# Patient Record
Sex: Male | Born: 1965 | Race: White | Hispanic: No | Smoking: Current every day smoker
Health system: Southern US, Community
[De-identification: ages and names within clinical notes are randomized; demographics above are authoritative.]

## PROBLEM LIST (undated history)

## (undated) DIAGNOSIS — I10 Essential (primary) hypertension: Secondary | ICD-10-CM

## (undated) DIAGNOSIS — M109 Gout, unspecified: Secondary | ICD-10-CM

## (undated) HISTORY — PX: KNEE SURGERY: SHX244

---

## 2006-03-11 ENCOUNTER — Emergency Department (HOSPITAL_COMMUNITY): Admission: EM | Admit: 2006-03-11 | Discharge: 2006-03-12 | Payer: Self-pay | Admitting: Emergency Medicine

## 2006-03-12 ENCOUNTER — Emergency Department (HOSPITAL_COMMUNITY): Admission: EM | Admit: 2006-03-12 | Discharge: 2006-03-13 | Payer: Self-pay | Admitting: Emergency Medicine

## 2006-03-15 ENCOUNTER — Emergency Department (HOSPITAL_COMMUNITY): Admission: EM | Admit: 2006-03-15 | Discharge: 2006-03-15 | Payer: Self-pay | Admitting: Family Medicine

## 2006-03-19 ENCOUNTER — Emergency Department (HOSPITAL_COMMUNITY): Admission: EM | Admit: 2006-03-19 | Discharge: 2006-03-19 | Payer: Self-pay | Admitting: Family Medicine

## 2009-10-24 ENCOUNTER — Emergency Department (HOSPITAL_BASED_OUTPATIENT_CLINIC_OR_DEPARTMENT_OTHER): Admission: EM | Admit: 2009-10-24 | Discharge: 2009-10-24 | Payer: Self-pay | Admitting: Emergency Medicine

## 2009-10-24 ENCOUNTER — Ambulatory Visit: Payer: Self-pay | Admitting: Diagnostic Radiology

## 2010-01-07 ENCOUNTER — Emergency Department (HOSPITAL_BASED_OUTPATIENT_CLINIC_OR_DEPARTMENT_OTHER): Admission: EM | Admit: 2010-01-07 | Discharge: 2010-01-07 | Payer: Self-pay | Admitting: Emergency Medicine

## 2010-01-07 ENCOUNTER — Ambulatory Visit: Payer: Self-pay | Admitting: Interventional Radiology

## 2010-01-14 ENCOUNTER — Emergency Department (HOSPITAL_BASED_OUTPATIENT_CLINIC_OR_DEPARTMENT_OTHER): Admission: EM | Admit: 2010-01-14 | Discharge: 2010-01-15 | Payer: Self-pay | Admitting: Emergency Medicine

## 2010-01-14 ENCOUNTER — Ambulatory Visit: Payer: Self-pay | Admitting: Diagnostic Radiology

## 2010-09-05 ENCOUNTER — Emergency Department (HOSPITAL_BASED_OUTPATIENT_CLINIC_OR_DEPARTMENT_OTHER)
Admission: EM | Admit: 2010-09-05 | Discharge: 2010-09-06 | Payer: Self-pay | Source: Home / Self Care | Admitting: Emergency Medicine

## 2010-12-09 LAB — URINE MICROSCOPIC-ADD ON

## 2010-12-09 LAB — BASIC METABOLIC PANEL
CO2: 29 mEq/L (ref 19–32)
Calcium: 9.3 mg/dL (ref 8.4–10.5)
Chloride: 106 mEq/L (ref 96–112)
Potassium: 4.4 mEq/L (ref 3.5–5.1)

## 2010-12-09 LAB — DIFFERENTIAL
Basophils Relative: 3 % — ABNORMAL HIGH (ref 0–1)
Eosinophils Absolute: 0 10*3/uL (ref 0.0–0.7)
Eosinophils Relative: 0 % (ref 0–5)
Monocytes Absolute: 0.9 10*3/uL (ref 0.1–1.0)
Monocytes Relative: 7 % (ref 3–12)
Neutro Abs: 10.6 10*3/uL — ABNORMAL HIGH (ref 1.7–7.7)

## 2010-12-09 LAB — URINALYSIS, ROUTINE W REFLEX MICROSCOPIC
Glucose, UA: NEGATIVE mg/dL
Leukocytes, UA: NEGATIVE
Nitrite: NEGATIVE
Protein, ur: NEGATIVE mg/dL
pH: 6 (ref 5.0–8.0)

## 2010-12-09 LAB — CBC
HCT: 44.8 % (ref 39.0–52.0)
Hemoglobin: 15.2 g/dL (ref 13.0–17.0)
MCHC: 34 g/dL (ref 30.0–36.0)
MCV: 96.6 fL (ref 78.0–100.0)
RDW: 11.9 % (ref 11.5–15.5)
WBC: 13.2 10*3/uL — ABNORMAL HIGH (ref 4.0–10.5)

## 2011-02-04 IMAGING — CT CT ABD-PELV W/O CM
2 of 4 series · 17 of 46 positions shown, 19 images · non-contrast
Comparison: None.

CLINICAL DATA: Right flank pain radiating into the low back and
testicle.

CT ABDOMEN AND PELVIS WITHOUT CONTRAST
TECHNIQUE: Multidetector CT imaging of the abdomen and pelvis was
performed following the standard protocol without intravenous
contrast.

[Series 2: renal stone < 200 lbs 5.0 b31f · axial · 0.72mm/px · z∈[-491,-46]mm · 14 of 97 slices shown, 16 images]
[im 4/97  soft-tissue]
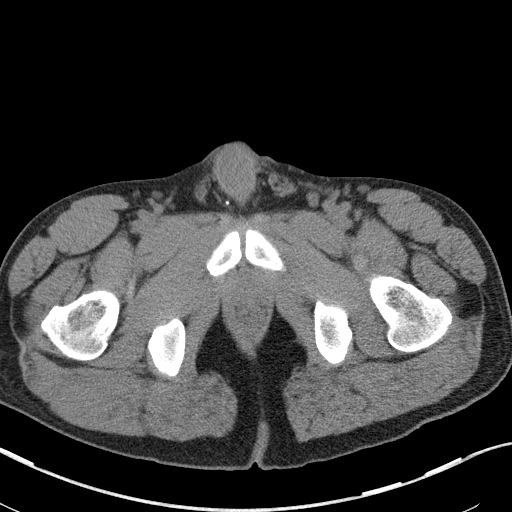
[im 4/97  bone]
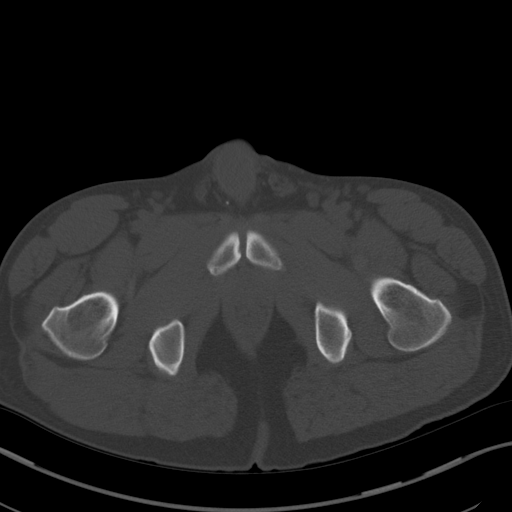
[im 12/97  soft-tissue]
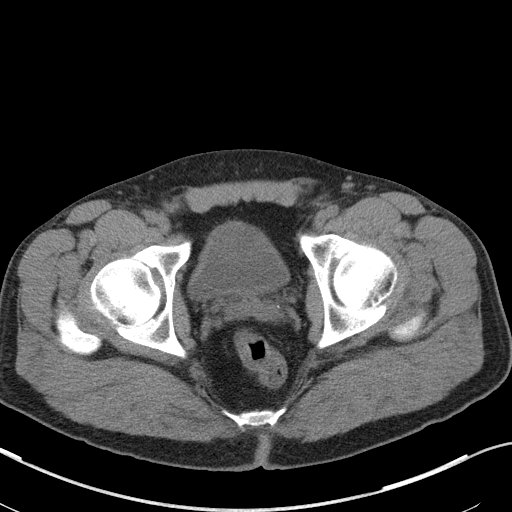
[im 19/97  soft-tissue]
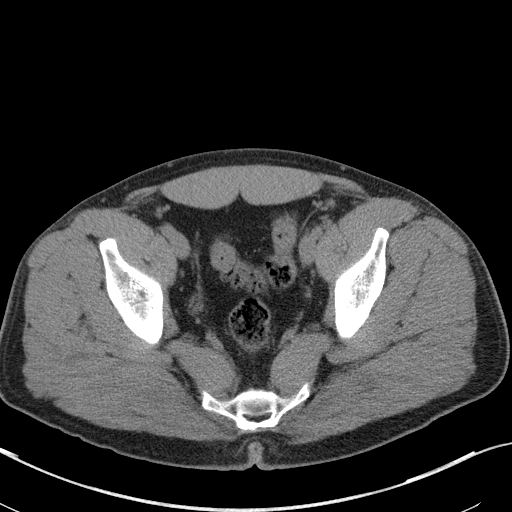
[im 26/97  soft-tissue]
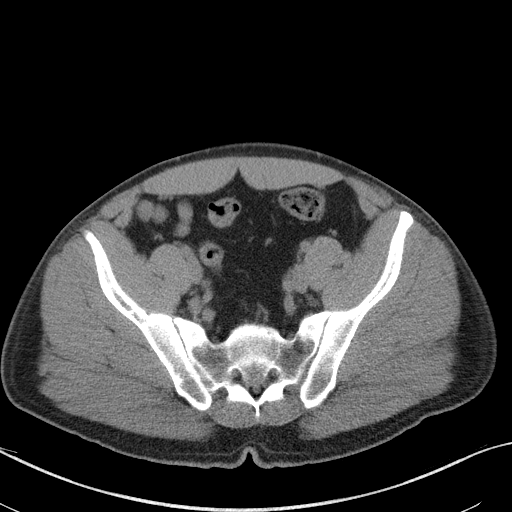
[im 34/97  soft-tissue]
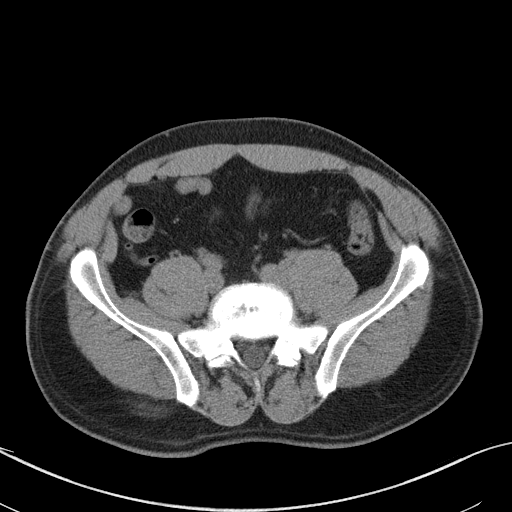
[im 37/97  soft-tissue]
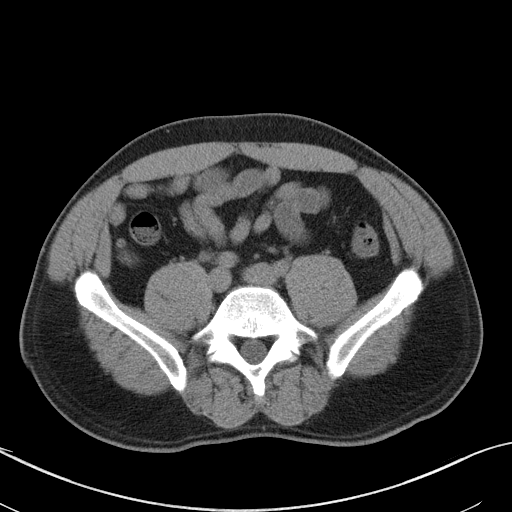
[im 45/97  soft-tissue]
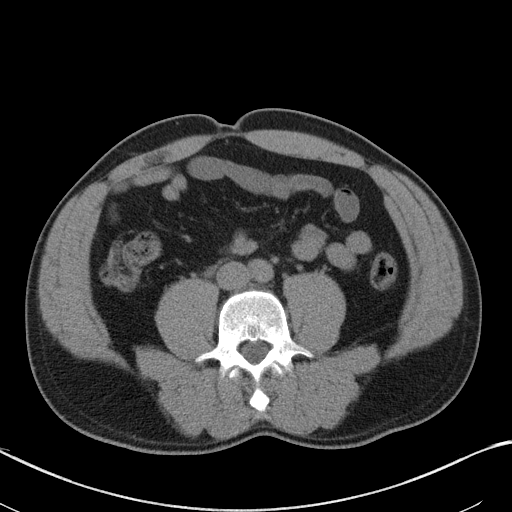
[im 52/97  soft-tissue]
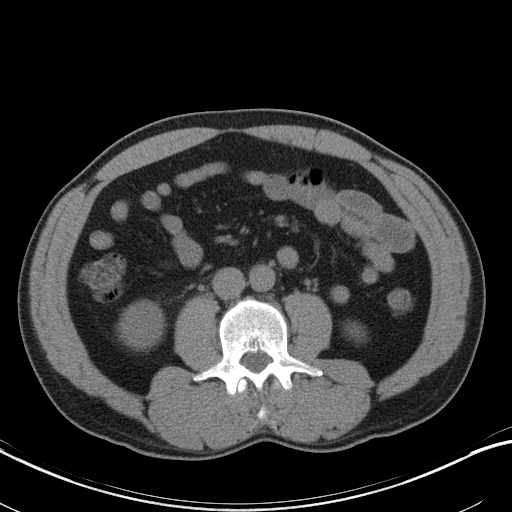
[im 60/97  soft-tissue]
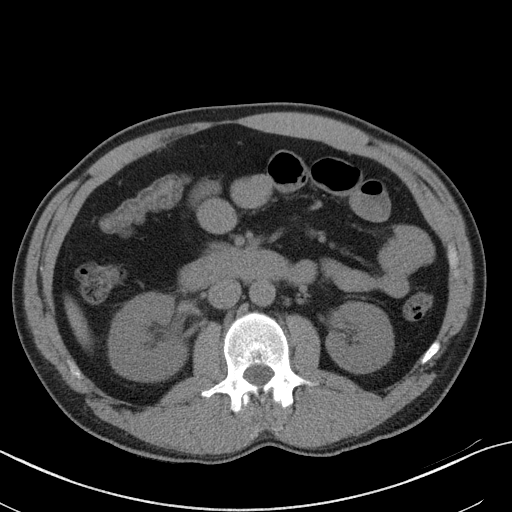
[im 60/97  bone]
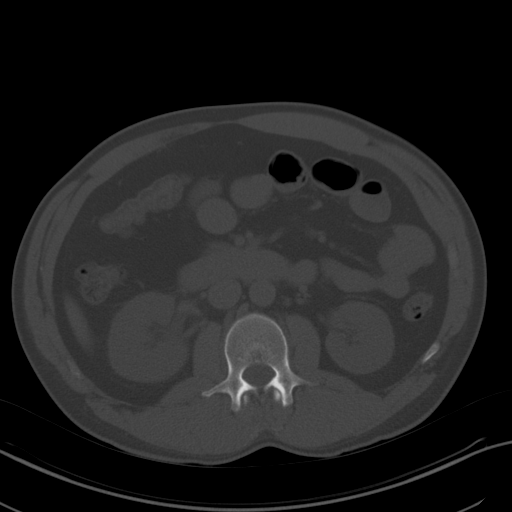
[im 63/97  soft-tissue]
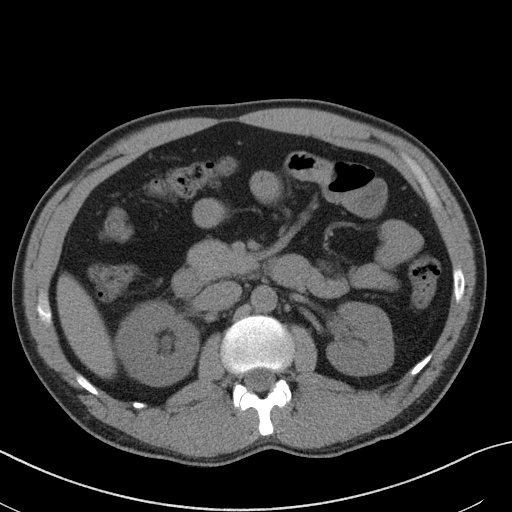
[im 71/97  soft-tissue]
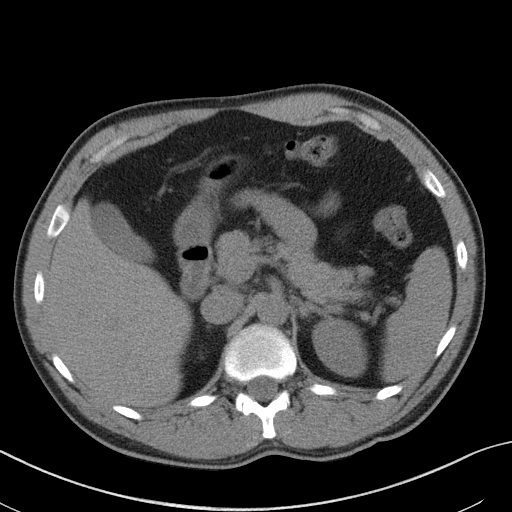
[im 78/97  soft-tissue]
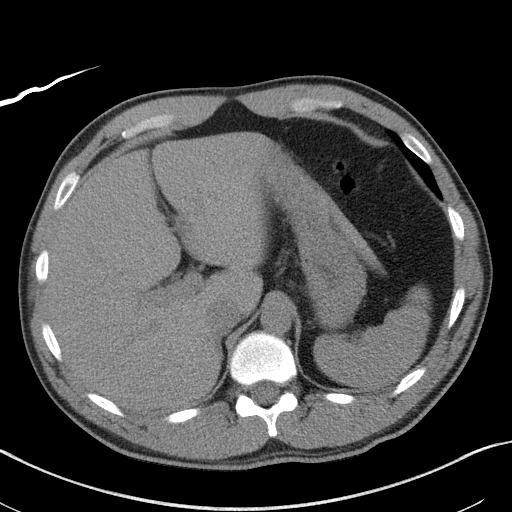
[im 85/97  soft-tissue]
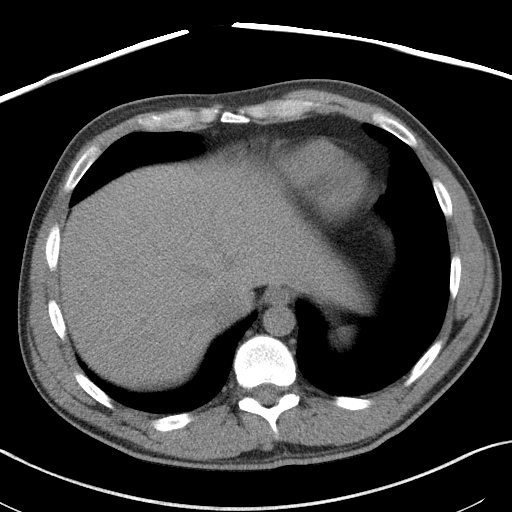
[im 93/97  soft-tissue]
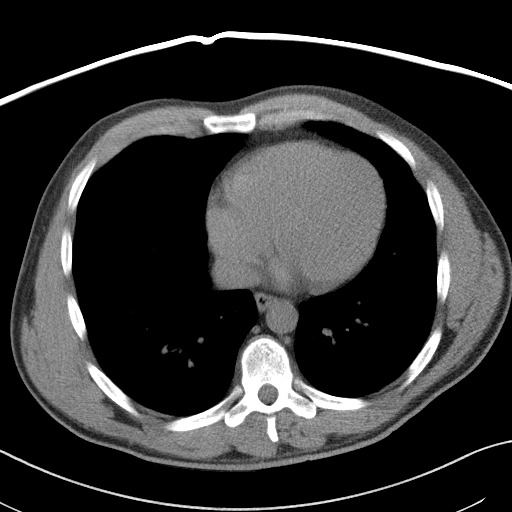

[Series 5: renal stone 3.0 coronal · coronal · 0.92mm/px · 3 of 92 slices shown]
[im 31/92  soft-tissue]
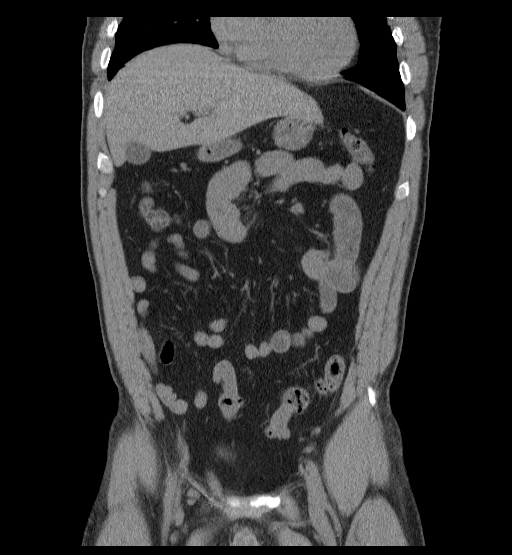
[im 41/92  soft-tissue]
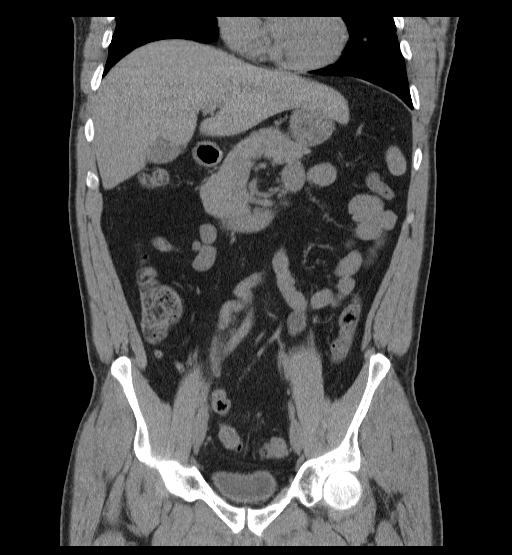
[im 51/92  soft-tissue]
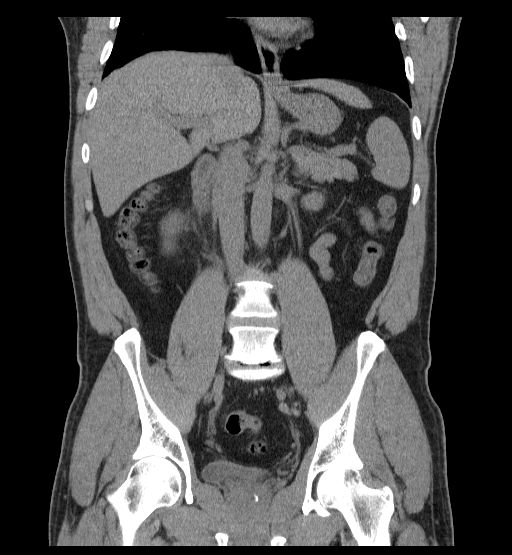

[17 of 46 positions shown; findings below may reference images not displayed]

FINDINGS: There is evidence of mild hydroureteronephrosis on the
right secondary to a distal ureteral calculus measuring
approximately 5 mm and located approximately 2 - 3 cm proximal to
the ureterovesicle junction.  A tiny nonobstructing calculus is
also present in the upper pole of the right kidney.  No left-sided
calculi or obstruction present.

Unenhanced solid organs including the liver, gallbladder, pancreas,
spleen and adrenal glands are unremarkable.  Bowel is normal in
appearance.  No abnormal fluid collections, masses or hernias.  The
bladder is decompressed.
IMPRESSION: Mild right-sided hydronephrosis and hydroureter secondary to 5 mm
distal ureteral calculus.

## 2011-10-02 ENCOUNTER — Encounter (HOSPITAL_BASED_OUTPATIENT_CLINIC_OR_DEPARTMENT_OTHER): Payer: Self-pay | Admitting: *Deleted

## 2011-10-02 ENCOUNTER — Emergency Department (HOSPITAL_BASED_OUTPATIENT_CLINIC_OR_DEPARTMENT_OTHER)
Admission: EM | Admit: 2011-10-02 | Discharge: 2011-10-02 | Disposition: A | Discharge status: Home / Self Care | Payer: Self-pay | Attending: Emergency Medicine | Admitting: Emergency Medicine

## 2011-10-02 DIAGNOSIS — M109 Gout, unspecified: Secondary | ICD-10-CM | POA: Insufficient documentation

## 2011-10-02 DIAGNOSIS — F17200 Nicotine dependence, unspecified, uncomplicated: Secondary | ICD-10-CM | POA: Insufficient documentation

## 2011-10-02 MED ORDER — HYDROCODONE-ACETAMINOPHEN 5-500 MG PO TABS: 1.0000 | ORAL_TABLET | Freq: Four times a day (QID) | ORAL | Status: AC | PRN

## 2011-10-02 MED ORDER — COLCHICINE 0.6 MG PO TABS: ORAL_TABLET | ORAL | Status: DC

## 2011-10-02 MED ORDER — INDOMETHACIN 50 MG PO CAPS: 50.0000 mg | ORAL_CAPSULE | Freq: Three times a day (TID) | ORAL | Status: AC

## 2011-10-02 MED ORDER — INDOMETHACIN 25 MG PO CAPS: 50.0000 mg | ORAL_CAPSULE | Freq: Three times a day (TID) | ORAL | Status: DC | PRN

## 2011-10-02 MED ORDER — INDOMETHACIN 25 MG PO CAPS
50.0000 mg | ORAL_CAPSULE | Freq: Once | ORAL | Status: AC
  Administered 2011-10-02: 50 mg via ORAL
  Filled 2011-10-02: qty 2

## 2011-10-02 NOTE — ED Provider Notes (Signed)
History     CSN: [id number]  Arrival date & time 10/02/11  0445   First MD Initiated Contact with Patient 10/02/11 0454      Chief Complaint  Patient presents with  . Elbow Pain    (Consider location/radiation/quality/duration/timing/severity/associated sxs/prior treatment) HPI Patient is a 46 year old male who presents today complaining of right elbow pain and swelling with no history of trauma. Patient did not notice this until he got home from work. He states his pain is now a 10 out of 10. 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 When asked patient does note that he has had attacks of gout before but that they have occurred in his feet. There are no overlying skin changes. Patient has no systemic signs of infection including nausea, vomiting, or fevers. Pain is worse with movement and palpation and better with rest. There are no other associated or modifying factors. History reviewed. No pertinent past medical history.  History reviewed. No pertinent past surgical history.  History reviewed. No pertinent family history.  History  Substance Use Topics  . Smoking status: Current Everyday Smoker  . Smokeless tobacco: Not on file  . Alcohol Use:       Review of Systems  Constitutional: Negative.   HENT: Negative.   Eyes: Negative.   Respiratory: Negative.   Cardiovascular: Negative.   Gastrointestinal: Negative.   Genitourinary: Negative.   Musculoskeletal: Positive for joint swelling.       Right elbow pain  Neurological: Negative.   Hematological: Negative.   Psychiatric/Behavioral: Negative.   All other systems reviewed and are negative.    Allergies  Review of patient's allergies indicates no known allergies.  Home Medications   Current Outpatient Rx  Name Route Sig Dispense Refill  . COLCHICINE 0.6 MG PO TABS  Take 2 tabs at first sign of gout flare.  If symptoms do not resolve take an additional tab one hour later.  6 tablet 0  . HYDROCODONE-ACETAMINOPHEN 5-500 MG PO TABS Oral Take 1-2 tablets by mouth every 6 (six) hours as needed for pain. 15 tablet 0  . INDOMETHACIN 50 MG PO CAPS Oral Take 1 capsule (50 mg total) by mouth 3 (three) times daily with meals. 30 capsule 0    BP 149/96  Pulse 75  Temp(Src) 97.4 F (36.3 C) (Oral)  Resp 18  SpO2 99%  Physical Exam  Nursing note and vitals reviewed. Constitutional: He is oriented to person, place, and time. He appears well-developed and well-nourished.  HENT:  Head: Normocephalic and atraumatic.  Eyes: Conjunctivae and EOM are normal. Pupils are equal, round, and reactive to light.  Neck: Normal range of motion.  Cardiovascular: Normal rate, regular rhythm, normal heart sounds and intact distal pulses.  Exam reveals no gallop and no friction rub.   No murmur heard. Pulmonary/Chest: Effort normal and breath sounds normal. No respiratory distress. He has no wheezes. He has no rales.  Musculoskeletal: Normal range of motion. He exhibits tenderness.       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 This is 2 inches in diameter. He states that it just appeared today. He noted pain only today. Area is tender to the touch but no overlying skin changes. This does not appear to be a septic joint  Neurological: He is alert and oriented to person, place, and time.  Skin: Skin is warm and dry. No rash noted.  Psychiatric: He has a normal mood and affect.  ED Course  Procedures (including critical care time)  Labs Reviewed - No data to display No results found.   1. Gout attack       MDM  Patient was evaluated by myself. He was given into the face and 50 mg by mouth for acute gout attack. Patient did have a history of gout and tophus was consistent with this. Patient did not have any overlying skin changes to suggest a cellulitis or septic joint. Patient was given a prescription for indomethacin. Patient was also given a prescription for  culture seen that he contrast this is ineffective. As a last ditch effort patient was given prescription for Vicodin if he does not have any resolution of his symptoms. Patient was told that he should pursue followup with her primary care doctor as he has had several attacks of gout and that he may need followup for this on long-term basis.        Cyndra Numbers, MD 10/02/11 [phone]

## 2011-10-02 NOTE — ED Notes (Signed)
C/o right elbow pain and swelling since yesterday, denies injury

## 2013-02-05 ENCOUNTER — Emergency Department (HOSPITAL_BASED_OUTPATIENT_CLINIC_OR_DEPARTMENT_OTHER)
Admission: EM | Admit: 2013-02-05 | Discharge: 2013-02-05 | Disposition: A | Discharge status: Home / Self Care | Payer: Self-pay | Attending: Emergency Medicine | Admitting: Emergency Medicine

## 2013-02-05 ENCOUNTER — Encounter (HOSPITAL_BASED_OUTPATIENT_CLINIC_OR_DEPARTMENT_OTHER): Payer: Self-pay | Admitting: *Deleted

## 2013-02-05 DIAGNOSIS — M109 Gout, unspecified: Secondary | ICD-10-CM | POA: Insufficient documentation

## 2013-02-05 DIAGNOSIS — F17200 Nicotine dependence, unspecified, uncomplicated: Secondary | ICD-10-CM | POA: Insufficient documentation

## 2013-02-05 HISTORY — DX: Gout, unspecified: M10.9

## 2013-02-05 MED ORDER — PREDNISONE 20 MG PO TABS: ORAL_TABLET | ORAL | Status: DC

## 2013-02-05 MED ORDER — PREDNISONE 50 MG PO TABS
60.0000 mg | ORAL_TABLET | Freq: Once | ORAL | Status: AC
  Administered 2013-02-05: 60 mg via ORAL
  Filled 2013-02-05: qty 1

## 2013-02-05 MED ORDER — HYDROCODONE-ACETAMINOPHEN 5-325 MG PO TABS: 2.0000 | ORAL_TABLET | Freq: Four times a day (QID) | ORAL | Status: DC | PRN

## 2013-02-05 NOTE — ED Notes (Signed)
Pt states he has a hx of gout and this same pain started in his right toe and has slowly worked its way up to his right knee. Difficulty ambulating.

## 2013-02-05 NOTE — ED Provider Notes (Signed)
History    This chart was scribed for Charles B. Bernette Mayers, MD by Quintella Reichert, ED scribe.  This patient was seen in room MH12/MH12 and the patient's care was started at 3:27 PM.   CSN: [id number]  Arrival date & time 02/05/13  1511     Chief Complaint  Patient presents with  . Gout     The history is provided by the patient. No language interpreter was used.    HPI Comments: ZARED KNOTH is a 47 y.o. male with h/o gout who presents to the Emergency Department complaining of gradually-worsening swelling and pain in joints of lower extremities that began 1 month ago.  Pt states that symptoms initially presented in his right great toe.  He began taking colchicine at that time, and subsequently symptoms switched to his left great toe.  Pt notes he was also treating with cherry extract pills at that time.  Several days ago pt noted swelling throughout both feet on waking, in all of the toes and in the ankles.  Today he began to observe swelling in the right knee as well.  He denies visible redness or tactile warmth in affected areas.  Pt denies fever, rash, emesis, SOB, or any other associated symptoms.  Pt notes mild diarrhea, possibly associated with colchicine. He notes that he is on his feet all day at work.  Pt also notes h/o cellulitis.   Past Medical History  Diagnosis Date  . Gout     Past Surgical History  Procedure Laterality Date  . Knee surgery      History reviewed. No pertinent family history.  History  Substance Use Topics  . Smoking status: Current Every Day Smoker  . Smokeless tobacco: Not on file  . Alcohol Use: No      Review of Systems A complete 10 system review of systems was obtained and all systems are negative except as noted in the HPI and PMH.    Allergies  Review of patient's allergies indicates no known allergies.  Home Medications   Current Outpatient Rx  Name  Route  Sig  Dispense  Refill  . colchicine 0.6 MG tablet      Take 2  tabs at first sign of gout flare.  If symptoms do not resolve take an additional tab one hour later.   6 tablet   0     BP 136/93  Pulse 72  Temp(Src) 98.2 F (36.8 C) (Oral)  Resp 20  Ht 5\' 10"  (1.778 m)  Wt 190 lb (86.183 kg)  BMI 27.26 kg/m2  SpO2 98%  Physical Exam  Nursing note and vitals reviewed. Constitutional: He is oriented to person, place, and time. He appears well-developed and well-nourished.  HENT:  Head: Normocephalic and atraumatic.  Eyes: EOM are normal. Pupils are equal, round, and reactive to light.  Neck: Normal range of motion. Neck supple.  Cardiovascular: Normal rate, regular rhythm, normal heart sounds and intact distal pulses.   No murmur heard. Pulmonary/Chest: Effort normal and breath sounds normal. No respiratory distress. He has no wheezes. He has no rales.  Abdominal: Bowel sounds are normal. He exhibits no distension. There is no tenderness.  Musculoskeletal: Normal range of motion. He exhibits edema and tenderness.  Tenderness in great toe of right foot, right ankle, right knee.  Minimal effusion.  No erythema or warmth.  Neurological: He is alert and oriented to person, place, and time. He has normal strength. No cranial nerve deficit or sensory deficit.  Skin: Skin is warm and dry. No rash noted.  Psychiatric: He has a normal mood and affect.    ED Course  Procedures (including critical care time)  DIAGNOSTIC STUDIES: Oxygen Saturation is 98% on room air, normal by my interpretation.    COORDINATION OF CARE: 3:32 PM-Discussed treatment plan which includes steroid medication, pain medication and f/u with orthopedist with pt at bedside and pt agreed to plan.      Labs Reviewed - No data to display No results found.   1. Gout       MDM  Likely gout exacerbation. Prednisone, percocet and PCP follow up      I personally performed the services described in this documentation, which was scribed in my presence. The recorded  information has been reviewed and is accurate.    Charles B. Bernette Mayers, MD 02/06/13 1453

## 2013-02-05 NOTE — ED Notes (Signed)
D/c with rx x 2 for norco and prednisone

## 2014-08-06 ENCOUNTER — Encounter (HOSPITAL_BASED_OUTPATIENT_CLINIC_OR_DEPARTMENT_OTHER): Payer: Self-pay | Admitting: *Deleted

## 2014-08-06 ENCOUNTER — Emergency Department (HOSPITAL_BASED_OUTPATIENT_CLINIC_OR_DEPARTMENT_OTHER)
Admission: EM | Admit: 2014-08-06 | Discharge: 2014-08-06 | Disposition: A | Discharge status: Home / Self Care | Payer: Self-pay | Attending: Emergency Medicine | Admitting: Emergency Medicine

## 2014-08-06 DIAGNOSIS — R1110 Vomiting, unspecified: Secondary | ICD-10-CM | POA: Insufficient documentation

## 2014-08-06 DIAGNOSIS — R05 Cough: Secondary | ICD-10-CM

## 2014-08-06 DIAGNOSIS — R197 Diarrhea, unspecified: Secondary | ICD-10-CM | POA: Insufficient documentation

## 2014-08-06 DIAGNOSIS — Z8739 Personal history of other diseases of the musculoskeletal system and connective tissue: Secondary | ICD-10-CM | POA: Insufficient documentation

## 2014-08-06 DIAGNOSIS — R059 Cough, unspecified: Secondary | ICD-10-CM

## 2014-08-06 DIAGNOSIS — Z720 Tobacco use: Secondary | ICD-10-CM | POA: Insufficient documentation

## 2014-08-06 DIAGNOSIS — M791 Myalgia: Secondary | ICD-10-CM | POA: Insufficient documentation

## 2014-08-06 DIAGNOSIS — B9789 Other viral agents as the cause of diseases classified elsewhere: Secondary | ICD-10-CM

## 2014-08-06 DIAGNOSIS — J069 Acute upper respiratory infection, unspecified: Secondary | ICD-10-CM | POA: Insufficient documentation

## 2014-08-06 DIAGNOSIS — J029 Acute pharyngitis, unspecified: Secondary | ICD-10-CM

## 2014-08-06 NOTE — Discharge Instructions (Signed)
Cough, Adult  A cough is a reflex that helps clear your throat and airways. It can help heal the body or may be a reaction to an irritated airway. A cough may only last 2 or 3 weeks (acute) or may last more than 8 weeks (chronic).  CAUSES Acute cough:  Viral or bacterial infections. Chronic cough:  Infections.  Allergies.  Asthma.  Post-nasal drip.  Smoking.  Heartburn or acid reflux.  Some medicines.  Chronic lung problems (COPD).  Cancer. SYMPTOMS   Cough.  Fever.  Chest pain.  Increased breathing rate.  High-pitched whistling sound when breathing (wheezing).  Colored mucus that you cough up (sputum). TREATMENT   A bacterial cough may be treated with antibiotic medicine.  A viral cough must run its course and will not respond to antibiotics.  Your caregiver may recommend other treatments if you have a chronic cough. HOME CARE INSTRUCTIONS   Only take over-the-counter or prescription medicines for pain, discomfort, or fever as directed by your caregiver. Use cough suppressants only as directed by your caregiver.  Use a cold steam vaporizer or humidifier in your bedroom or home to help loosen secretions.  Sleep in a semi-upright position if your cough is worse at night.  Rest as needed.  Stop smoking if you smoke. SEEK IMMEDIATE MEDICAL CARE IF:   You have pus in your sputum.  Your cough starts to worsen.  You cannot control your cough with suppressants and are losing sleep.  You begin coughing up blood.  You have difficulty breathing.  You develop pain which is getting worse or is uncontrolled with medicine.  You have a fever. MAKE SURE YOU:   Understand these instructions.  Will watch your condition.  Will get help right away if you are not doing well or get worse. Document Released: 03/06/2011 Document Revised: 11/30/2011 Document Reviewed: 03/06/2011 [employer] Patient Information 2015 [employer]. This information is not intended  to replace advice given to you by your health care provider. Make sure you discuss any questions you have with your health care provider.  

## 2014-08-06 NOTE — ED Provider Notes (Signed)
CSN: [id number]     Arrival date & time 08/06/14  0727 History   First MD Initiated Contact with Patient 08/06/14 [phone]     Chief Complaint  Patient presents with  . Cough      Patient is a 48 y.o. male presenting with cough. The history is provided by the patient.  Cough Severity:  Moderate Onset quality:  Gradual Duration:  2 days Timing:  Intermittent Progression:  Worsening Chronicity:  New Smoker: yes   Relieved by:  None tried Worsened by:  Nothing tried Associated symptoms: chills, myalgias, shortness of breath and sore throat   Associated symptoms: no fever     Past Medical History  Diagnosis Date  . Gout    Past Surgical History  Procedure Laterality Date  . Knee surgery     History reviewed. No pertinent family history. History  Substance Use Topics  . Smoking status: Current Every Day Smoker  . Smokeless tobacco: Not on file  . Alcohol Use: No    Review of Systems  Constitutional: Positive for chills. Negative for fever.  HENT: Positive for sore throat.   Respiratory: Positive for cough and shortness of breath.   Cardiovascular:       CP with cough   Gastrointestinal: Positive for vomiting and diarrhea.  Musculoskeletal: Positive for myalgias.  All other systems reviewed and are negative.     Allergies  Review of patient's allergies indicates no known allergies.  Home Medications   Prior to Admission medications   Not on File   BP 132/90 mmHg  Pulse 68  Temp(Src) 98.3 F (36.8 C) (Oral)  Resp 20  Ht 5\' 10"  (1.778 m)  Wt 190 lb (86.183 kg)  BMI 27.26 kg/m2  SpO2 96% Physical Exam CONSTITUTIONAL: Well developed/well nourished HEAD: Normocephalic/atraumatic EYES: EOMI/PERRL ENMT: Mucous membranes moist, uvula midline, mild erythema, no exudates noted Bilateral TMs clear and intact NECK: supple no meningeal signs SPINE:entire spine nontender CV: S1/S2 noted, no murmurs/rubs/gallops noted LUNGS: Lungs are clear to auscultation  bilaterally, no apparent distress ABDOMEN: soft, nontender, no rebound or guarding GU:no cva tenderness NEURO: Pt is awake/alert, moves all extremitiesx4 EXTREMITIES: pulses normal, full ROM SKIN: warm, color normal PSYCH: no abnormalities of mood noted  ED Course  Procedures   Pt well appearing, here with cough/ST He is in no distress Lung sounds clear Suspect viral URI Appropriate for d/c home   MDM   Final diagnoses:  Cough  Pharyngitis  Viral URI with cough    Nursing notes including past medical history and social history reviewed and considered in documentation     Joya Gaskinsonald W Burnham Trost, MD 08/06/14 [phone]

## 2014-08-06 NOTE — ED Notes (Signed)
Cough, sore throat x 2 days

## 2014-10-17 ENCOUNTER — Emergency Department (HOSPITAL_BASED_OUTPATIENT_CLINIC_OR_DEPARTMENT_OTHER)
Admission: EM | Admit: 2014-10-17 | Discharge: 2014-10-17 | Disposition: A | Discharge status: Home / Self Care | Payer: Self-pay | Attending: Emergency Medicine | Admitting: Emergency Medicine

## 2014-10-17 ENCOUNTER — Encounter (HOSPITAL_BASED_OUTPATIENT_CLINIC_OR_DEPARTMENT_OTHER): Payer: Self-pay | Admitting: *Deleted

## 2014-10-17 ENCOUNTER — Emergency Department (HOSPITAL_BASED_OUTPATIENT_CLINIC_OR_DEPARTMENT_OTHER): Payer: Self-pay

## 2014-10-17 DIAGNOSIS — Z79899 Other long term (current) drug therapy: Secondary | ICD-10-CM | POA: Insufficient documentation

## 2014-10-17 DIAGNOSIS — Y9389 Activity, other specified: Secondary | ICD-10-CM | POA: Insufficient documentation

## 2014-10-17 DIAGNOSIS — Z720 Tobacco use: Secondary | ICD-10-CM | POA: Insufficient documentation

## 2014-10-17 DIAGNOSIS — Y998 Other external cause status: Secondary | ICD-10-CM | POA: Insufficient documentation

## 2014-10-17 DIAGNOSIS — R109 Unspecified abdominal pain: Secondary | ICD-10-CM

## 2014-10-17 DIAGNOSIS — Y9289 Other specified places as the place of occurrence of the external cause: Secondary | ICD-10-CM | POA: Insufficient documentation

## 2014-10-17 DIAGNOSIS — S29019A Strain of muscle and tendon of unspecified wall of thorax, initial encounter: Secondary | ICD-10-CM | POA: Insufficient documentation

## 2014-10-17 DIAGNOSIS — X58XXXA Exposure to other specified factors, initial encounter: Secondary | ICD-10-CM | POA: Insufficient documentation

## 2014-10-17 DIAGNOSIS — Z8739 Personal history of other diseases of the musculoskeletal system and connective tissue: Secondary | ICD-10-CM | POA: Insufficient documentation

## 2014-10-17 DIAGNOSIS — S29011A Strain of muscle and tendon of front wall of thorax, initial encounter: Secondary | ICD-10-CM

## 2014-10-17 DIAGNOSIS — I10 Essential (primary) hypertension: Secondary | ICD-10-CM | POA: Insufficient documentation

## 2014-10-17 HISTORY — DX: Essential (primary) hypertension: I10

## 2014-10-17 MED ORDER — HYDROCODONE-ACETAMINOPHEN 5-325 MG PO TABS
2.0000 | ORAL_TABLET | Freq: Once | ORAL | Status: AC
  Administered 2014-10-17: 2 via ORAL
  Filled 2014-10-17: qty 2

## 2014-10-17 MED ORDER — IBUPROFEN 400 MG PO TABS: 400.0000 mg | ORAL_TABLET | Freq: Four times a day (QID) | ORAL | Status: AC | PRN

## 2014-10-17 MED ORDER — IBUPROFEN 400 MG PO TABS
400.0000 mg | ORAL_TABLET | Freq: Once | ORAL | Status: AC
  Administered 2014-10-17: 400 mg via ORAL
  Filled 2014-10-17: qty 1

## 2014-10-17 MED ORDER — HYDROCODONE-ACETAMINOPHEN 5-325 MG PO TABS: 1.0000 | ORAL_TABLET | ORAL | Status: AC | PRN

## 2014-10-17 NOTE — ED Notes (Signed)
C/o pain after sneezing yesterday. Felt something pop. Pain is right lower rib pain radiating in to umbilical area. Also c/o burning. States hurts worse with movement and deep breaths.

## 2014-10-17 NOTE — ED Provider Notes (Signed)
CSN: [id number]     Arrival date & time 10/17/14  1021 History   First MD Initiated Contact with Patient 10/17/14 1145     Chief Complaint  Patient presents with  . Abdominal Pain     (Consider location/radiation/quality/duration/timing/severity/associated sxs/prior Treatment) HPI The patient reports he sneezed yesterday and felt something pop in his right side. He reports ever since then he's had of fairly severe pain in his right lower ribs that shoots across his abdomen somewhat. He reports is burning and sharp in nature. He reports that twisting or bending makes it much worse. He denies feeling short of breath. He denies any history of other trauma to the chest wall. He has not had any associated nausea or vomiting.  He reports he was well prior to onset of the symptoms. Past Medical History  Diagnosis Date  . Hypertension   . Gout    Past Surgical History  Procedure Laterality Date  . Knee surgery     No family history on file. History  Substance Use Topics  . Smoking status: Current Every Day Smoker -- 1.00 packs/day    Types: Cigarettes  . Smokeless tobacco: Not on file  . Alcohol Use: No    Review of Systems 10 Systems reviewed and are negative for acute change except as noted in the HPI.    Allergies  Review of patient's allergies indicates no known allergies.  Home Medications   Prior to Admission medications   Medication Sig Start Date End Date Taking? Authorizing Provider  atorvastatin (LIPITOR) 10 MG tablet Take 10 mg by mouth daily.   Yes Historical Provider, MD  lisinopril (PRINIVIL,ZESTRIL) 20 MG tablet Take 20 mg by mouth daily.   Yes Historical Provider, MD  HYDROcodone-acetaminophen (NORCO/VICODIN) 5-325 MG per tablet Take 1-2 tablets by mouth every 4 (four) hours as needed for moderate pain or severe pain. 10/17/14   Arby BarretteMarcy Elsie Sakuma, MD  ibuprofen (ADVIL,MOTRIN) 400 MG tablet Take 1 tablet (400 mg total) by mouth every 6 (six) hours as needed. 10/17/14    Arby BarretteMarcy Tashaun Obey, MD   BP 136/90 mmHg  Pulse 72  Temp(Src) 98.1 F (36.7 C) (Oral)  Resp 18  Ht 5' 9.5" (1.765 m)  Wt 188 lb (85.276 kg)  BMI 27.37 kg/m2  SpO2 100% Physical Exam  Constitutional: He is oriented to person, place, and time. He appears well-developed and well-nourished.  HENT:  Head: Normocephalic and atraumatic.  Eyes: EOM are normal. Pupils are equal, round, and reactive to light.  Neck: Neck supple.  Cardiovascular: Normal rate, regular rhythm, normal heart sounds and intact distal pulses.   Pulmonary/Chest: Effort normal and breath sounds normal. No respiratory distress. He has no wheezes. He has no rales. He exhibits tenderness.    The patient has very focal reproducible chest wall pain that traces along approximately the eighth rib posteriorly to the anterior rib margin.  Abdominal: Soft. Bowel sounds are normal. He exhibits no distension. There is no tenderness.  No palpable hernias present.  Musculoskeletal: Normal range of motion. He exhibits no edema.  Neurological: He is alert and oriented to person, place, and time. He has normal strength. Coordination normal. GCS eye subscore is 4. GCS verbal subscore is 5. GCS motor subscore is 6.  Skin: Skin is warm, dry and intact.  Psychiatric: He has a normal mood and affect.    ED Course  Procedures (including critical care time) Labs Review Labs Reviewed - No data to display  Imaging Review Dg Chest 2  View  10/17/2014   CLINICAL DATA:  49 year old male with acute onset anterior rib pain after sneezing. Initial encounter.  EXAM: CHEST  2 VIEW  COMPARISON:  Chest radiographs 10/24/2009.  FINDINGS: Stable and normal lung volumes. Normal cardiac size and mediastinal contours. Visualized tracheal air column is within normal limits. No pneumothorax, pulmonary edema, pleural effusion or confluent pulmonary opacity. Chronic degenerative endplate changes in the mid thoracic spine. Bone mineralization is within normal  limits. No acute osseous abnormality identified.  IMPRESSION: No acute cardiopulmonary abnormality.   Electronically Signed   By: Augusto Gamble M.D.   On: 10/17/2014 12:35   Dg Ribs Unilateral Right  10/17/2014   CLINICAL DATA:  49 year old male with acute onset anterior rib pain after sneezing. Initial encounter.  EXAM: RIGHT RIBS - 2 VIEW  COMPARISON:  Chest radiographs from today reported separately. CT Abdomen and Pelvis 01/07/2010.  FINDINGS: Three oblique views of the right ribs. Normal thoracic segmentation. Rib marker placed at the anterior lateral right tenth rib level. No acute displaced rib fracture identified. No acute osseous abnormality identified. Right lung base within normal limits.  IMPRESSION: No displaced right rib fracture identified.   Electronically Signed   By: Augusto Gamble M.D.   On: 10/17/2014 12:37     EKG Interpretation None      MDM   Final diagnoses:  Chest wall muscle strain, initial encounter    Patient's history and physical examination are consistent with a chest wall strain. The x-rays do not show any rib fracture or pneumothorax. The patient is not experiencing dyspnea. At this time he will be treated for pain and instructed to follow-up with his family physician.   Arby Barrette, MD 10/17/14 1249

## 2014-10-17 NOTE — Discharge Instructions (Signed)

## 2015-11-14 IMAGING — CR DG CHEST 2V
2 series · 2 of 2 positions shown · non-contrast
Comparison: Chest radiographs 10/24/2009.

CLINICAL DATA: 48-year-old male with acute onset anterior rib pain
after sneezing. Initial encounter.

EXAM:
CHEST  2 VIEW

[w chest pa]
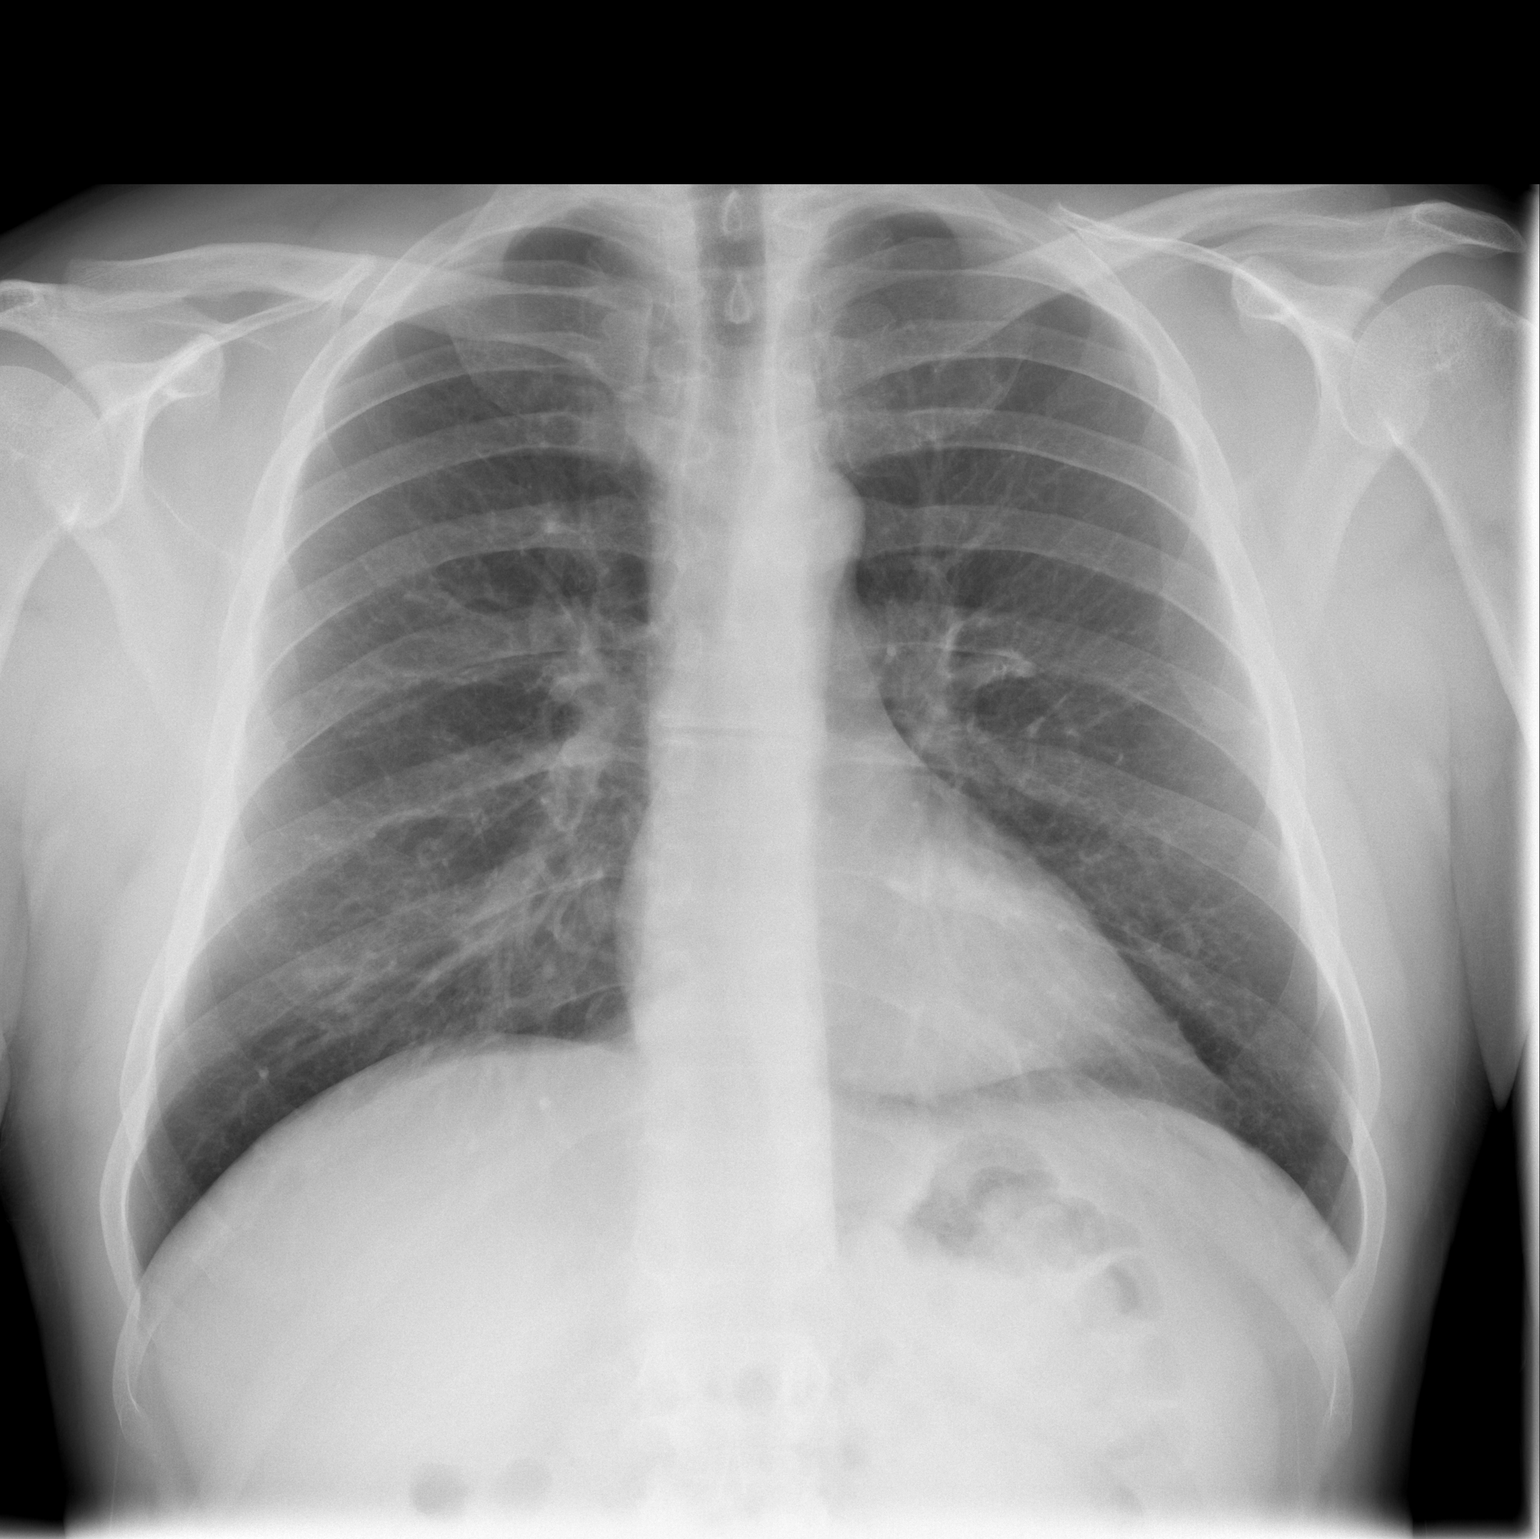

[w chest lat]
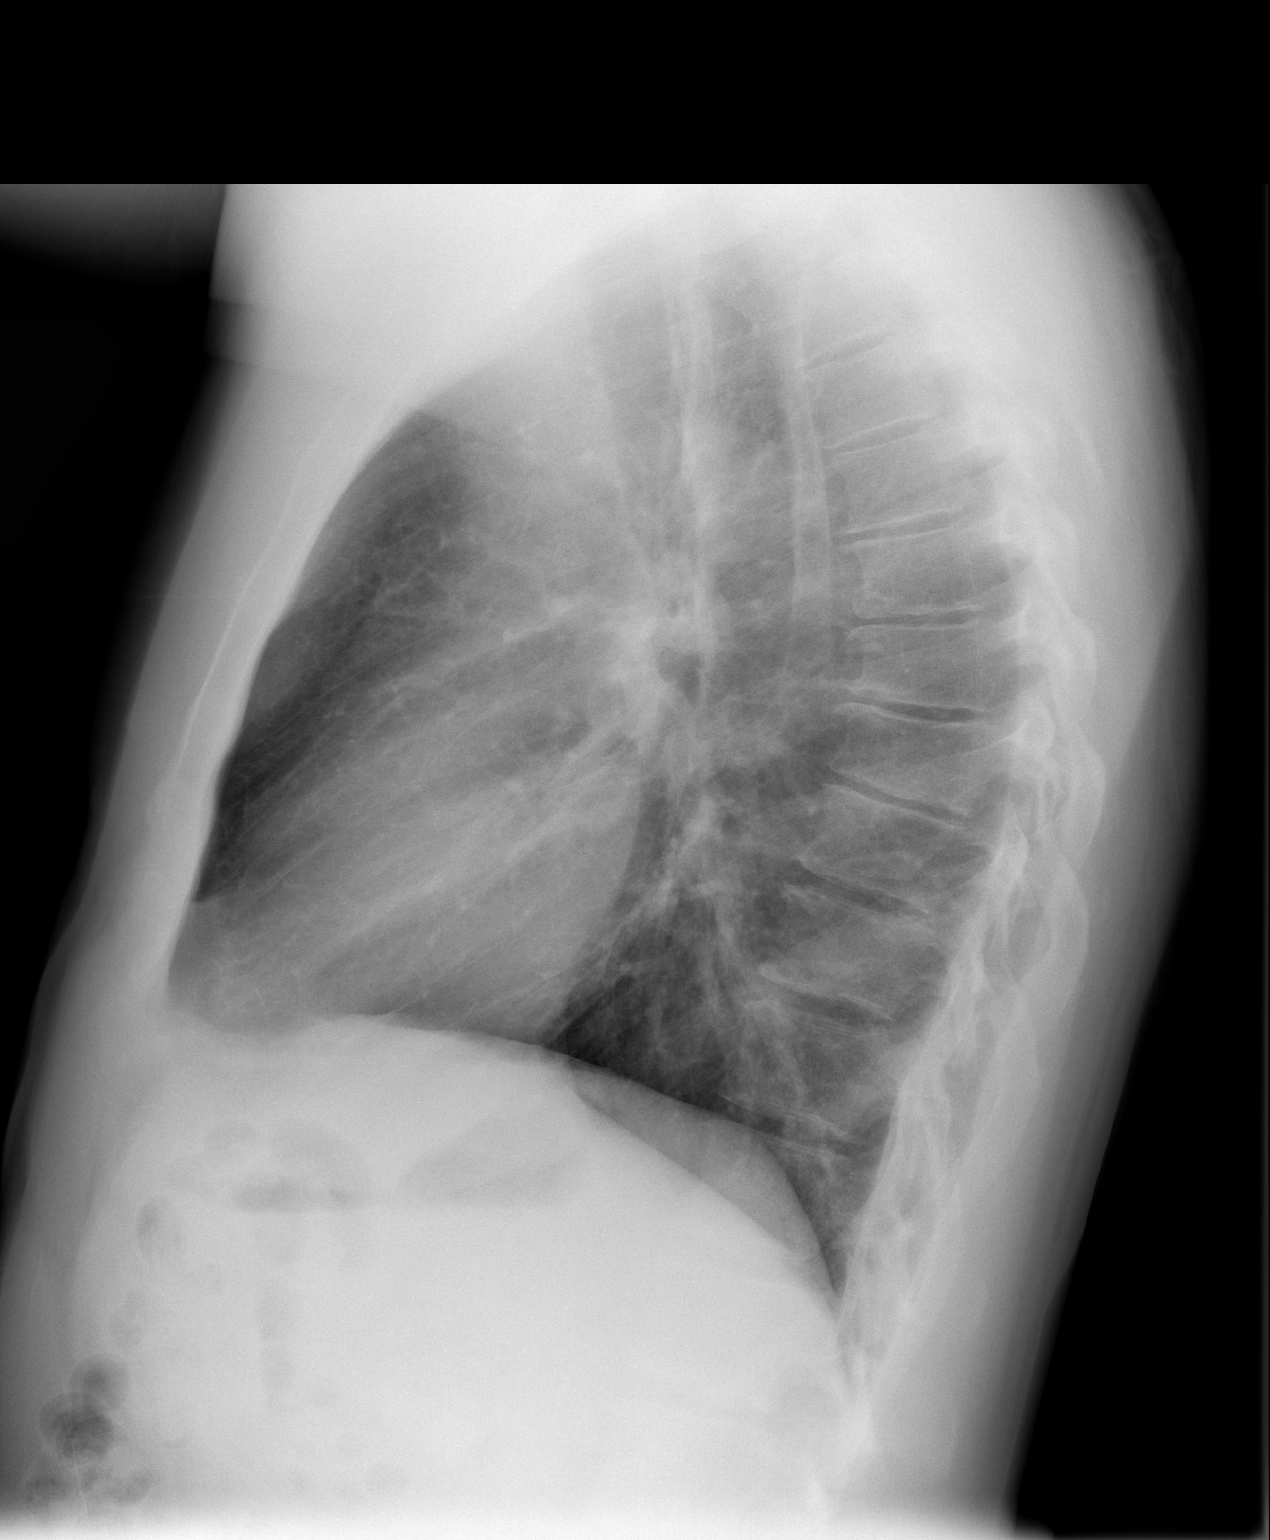

[2 of 2 positions shown; findings below may reference images not displayed]

FINDINGS: Stable and normal lung volumes. Normal cardiac size and mediastinal
contours. Visualized tracheal air column is within normal limits. No
pneumothorax, pulmonary edema, pleural effusion or confluent
pulmonary opacity. Chronic degenerative endplate changes in the mid
thoracic spine. Bone mineralization is within normal limits. No
acute osseous abnormality identified.
IMPRESSION: No acute cardiopulmonary abnormality.

## 2015-12-12 ENCOUNTER — Emergency Department (HOSPITAL_BASED_OUTPATIENT_CLINIC_OR_DEPARTMENT_OTHER)
Admission: EM | Admit: 2015-12-12 | Discharge: 2015-12-12 | Disposition: A | Discharge status: Home / Self Care | Payer: Self-pay | Attending: Emergency Medicine | Admitting: Emergency Medicine

## 2015-12-12 ENCOUNTER — Encounter (HOSPITAL_BASED_OUTPATIENT_CLINIC_OR_DEPARTMENT_OTHER): Payer: Self-pay | Admitting: *Deleted

## 2015-12-12 ENCOUNTER — Emergency Department (HOSPITAL_BASED_OUTPATIENT_CLINIC_OR_DEPARTMENT_OTHER): Payer: Self-pay

## 2015-12-12 DIAGNOSIS — R197 Diarrhea, unspecified: Secondary | ICD-10-CM | POA: Insufficient documentation

## 2015-12-12 DIAGNOSIS — I10 Essential (primary) hypertension: Secondary | ICD-10-CM | POA: Insufficient documentation

## 2015-12-12 DIAGNOSIS — B9789 Other viral agents as the cause of diseases classified elsewhere: Secondary | ICD-10-CM

## 2015-12-12 DIAGNOSIS — M109 Gout, unspecified: Secondary | ICD-10-CM | POA: Insufficient documentation

## 2015-12-12 DIAGNOSIS — M791 Myalgia: Secondary | ICD-10-CM | POA: Insufficient documentation

## 2015-12-12 DIAGNOSIS — Z79899 Other long term (current) drug therapy: Secondary | ICD-10-CM | POA: Insufficient documentation

## 2015-12-12 DIAGNOSIS — F17210 Nicotine dependence, cigarettes, uncomplicated: Secondary | ICD-10-CM | POA: Insufficient documentation

## 2015-12-12 DIAGNOSIS — M545 Low back pain: Secondary | ICD-10-CM | POA: Insufficient documentation

## 2015-12-12 DIAGNOSIS — J069 Acute upper respiratory infection, unspecified: Secondary | ICD-10-CM | POA: Insufficient documentation

## 2015-12-12 LAB — URINALYSIS, ROUTINE W REFLEX MICROSCOPIC
Bilirubin Urine: NEGATIVE
GLUCOSE, UA: NEGATIVE mg/dL
Hgb urine dipstick: NEGATIVE
Ketones, ur: NEGATIVE mg/dL
LEUKOCYTES UA: NEGATIVE
NITRITE: NEGATIVE
PH: 7 (ref 5.0–8.0)
Protein, ur: NEGATIVE mg/dL
SPECIFIC GRAVITY, URINE: 1.005 (ref 1.005–1.030)

## 2015-12-12 LAB — BASIC METABOLIC PANEL
ANION GAP: 9 (ref 5–15)
BUN: 8 mg/dL (ref 6–20)
CHLORIDE: 102 mmol/L (ref 101–111)
CO2: 24 mmol/L (ref 22–32)
Calcium: 8.6 mg/dL — ABNORMAL LOW (ref 8.9–10.3)
Creatinine, Ser: 1.17 mg/dL (ref 0.61–1.24)
GFR calc Af Amer: >60 mL/min (ref >60)
GFR calc non Af Amer: >60 mL/min (ref >60)
Glucose, Bld: 105 mg/dL — ABNORMAL HIGH (ref 65–99)
POTASSIUM: 3.6 mmol/L (ref 3.5–5.1)
SODIUM: 135 mmol/L (ref 135–145)

## 2015-12-12 LAB — CBC WITH DIFFERENTIAL/PLATELET
Basophils Absolute: 0 10*3/uL (ref 0.0–0.1)
Basophils Relative: 0 %
EOS ABS: 0 10*3/uL (ref 0.0–0.7)
Eosinophils Relative: 0 %
HCT: 43.7 % (ref 39.0–52.0)
HEMOGLOBIN: 15.4 g/dL (ref 13.0–17.0)
LYMPHS ABS: 0.8 10*3/uL (ref 0.7–4.0)
LYMPHS PCT: 15 %
MCH: 33.7 pg (ref 26.0–34.0)
MCHC: 35.2 g/dL (ref 30.0–36.0)
MCV: 95.6 fL (ref 78.0–100.0)
Monocytes Absolute: 0.7 10*3/uL (ref 0.1–1.0)
Monocytes Relative: 12 %
NEUTROS PCT: 73 %
Neutro Abs: 4 10*3/uL (ref 1.7–7.7)
Platelets: 170 10*3/uL (ref 150–400)
RBC: 4.57 MIL/uL (ref 4.22–5.81)
RDW: 11.7 % (ref 11.5–15.5)
WBC: 5.5 10*3/uL (ref 4.0–10.5)

## 2015-12-12 MED ORDER — BENZONATATE 100 MG PO CAPS: 100.0000 mg | ORAL_CAPSULE | Freq: Three times a day (TID) | ORAL | Status: AC

## 2015-12-12 MED ORDER — SODIUM CHLORIDE 0.9 % IV BOLUS (SEPSIS)
1000.0000 mL | Freq: Once | INTRAVENOUS | Status: AC
  Administered 2015-12-12: 1000 mL via INTRAVENOUS

## 2015-12-12 MED ORDER — KETOROLAC TROMETHAMINE 30 MG/ML IJ SOLN
30.0000 mg | Freq: Once | INTRAMUSCULAR | Status: AC
  Administered 2015-12-12: 30 mg via INTRAVENOUS
  Filled 2015-12-12: qty 1

## 2015-12-12 NOTE — ED Provider Notes (Signed)
CSN: [id number]     Arrival date & time 12/12/15  1629 History   First MD Initiated Contact with Patient 12/12/15 1720     Chief Complaint  Patient presents with  . Fever     (Consider location/radiation/quality/duration/timing/severity/associated sxs/prior Treatment) Patient is a 50 y.o. male presenting with fever. The history is provided by the patient. No language interpreter was used.  Fever Max temp prior to arrival:  101 Temp source:  Oral Severity:  Moderate Onset quality:  Gradual Duration:  2 days Timing:  Intermittent Progression:  Waxing and waning Chronicity:  New Associated symptoms: chills, congestion, cough, diarrhea, myalgias and nausea   Associated symptoms: no vomiting     Past Medical History  Diagnosis Date  . Hypertension   . Gout    Past Surgical History  Procedure Laterality Date  . Knee surgery     No family history on file. Social History  Substance Use Topics  . Smoking status: Current Every Day Smoker -- 1.00 packs/day    Types: Cigarettes  . Smokeless tobacco: None  . Alcohol Use: No    Review of Systems  Constitutional: Positive for fever and chills.  HENT: Positive for congestion.   Respiratory: Positive for cough.   Gastrointestinal: Positive for nausea and diarrhea. Negative for vomiting.  Musculoskeletal: Positive for myalgias and back pain.  All other systems reviewed and are negative.     Allergies  Review of patient's allergies indicates no known allergies.  Home Medications   Prior to Admission medications   Medication Sig Start Date End Date Taking? Authorizing Provider  ALLOPURINOL PO Take by mouth.   Yes Historical Provider, MD  atorvastatin (LIPITOR) 10 MG tablet Take 10 mg by mouth daily.    Historical Provider, MD  HYDROcodone-acetaminophen (NORCO/VICODIN) 5-325 MG per tablet Take 1-2 tablets by mouth every 4 (four) hours as needed for moderate pain or severe pain. 10/17/14   Arby Barrette, MD  ibuprofen  (ADVIL,MOTRIN) 400 MG tablet Take 1 tablet (400 mg total) by mouth every 6 (six) hours as needed. 10/17/14   Arby Barrette, MD  lisinopril (PRINIVIL,ZESTRIL) 20 MG tablet Take 20 mg by mouth daily.    Historical Provider, MD   BP 124/88 mmHg  Pulse 91  Temp(Src) 98.1 F (36.7 C) (Oral)  Resp 20  Ht  (1.778 m)  Wt 85.276 kg  BMI 26.98 kg/m2  SpO2 99% Physical Exam  Constitutional: He is oriented to person, place, and time. He appears well-developed and well-nourished.  HENT:  Head: Normocephalic.  Eyes: Pupils are equal, round, and reactive to light.  Neck: Neck supple.  Cardiovascular: Normal rate and regular rhythm.   Pulmonary/Chest: Effort normal and breath sounds normal.  Abdominal: Soft. Bowel sounds are normal.  Musculoskeletal: He exhibits tenderness. He exhibits no edema.  Lymphadenopathy:    He has no cervical adenopathy.  Neurological: He is alert and oriented to person, place, and time.  Skin: Skin is warm and dry.  Psychiatric: He has a normal mood and affect.  Nursing note and vitals reviewed.   ED Course  Procedures (including critical care time) Labs Review Labs Reviewed  BASIC METABOLIC PANEL - Abnormal; Notable for the following:    Glucose, Bld 105 (*)    Calcium 8.6 (*)    All other components within normal limits  CBC WITH DIFFERENTIAL/PLATELET  URINALYSIS, ROUTINE W REFLEX MICROSCOPIC (NOT AT [facility])    Imaging Review Dg Chest 2 View  12/12/2015  CLINICAL DATA:  Cough,  headache, sore throat EXAM: CHEST  2 VIEW COMPARISON:  10/17/2014 FINDINGS: The heart size and mediastinal contours are within normal limits. Both lungs are clear. The visualized skeletal structures are unremarkable. IMPRESSION: No active cardiopulmonary disease. Electronically Signed   By: Elige KoHetal  Patel   On: 12/12/2015 18:17   I have personally reviewed and evaluated these images and lab results as part of my medical decision-making.   EKG Interpretation None     Lab and  radiology results reviewed and shared with patient, are reassuring. Patient feels better after IV fluids and medication. MDM   Final diagnoses:  None    Viral URI with cough. Anti-tussive prescription. Care instructions provided. Return precautions discussed. Follow-up with PCP.    Felicie Mornavid Jazlyn Tippens, NP 12/13/15 0222  Arby BarretteMarcy Pfeiffer, MD 12/16/15 [phone]

## 2015-12-12 NOTE — ED Notes (Signed)
Cough, headache, sore throat, body aches and fever x 2 days. Diarrhea.

## 2015-12-12 NOTE — Discharge Instructions (Signed)
Upper Respiratory Infection, Adult Most upper respiratory infections (URIs) are a viral infection of the air passages leading to the lungs. A URI affects the nose, throat, and upper air passages. The most common type of URI is nasopharyngitis and is typically referred to as "the common cold." URIs run their course and usually go away on their own. Most of the time, a URI does not require medical attention, but sometimes a bacterial infection in the upper airways can follow a viral infection. This is called a secondary infection. Sinus and middle ear infections are common types of secondary upper respiratory infections. Bacterial pneumonia can also complicate a URI. A URI can worsen asthma and chronic obstructive pulmonary disease (COPD). Sometimes, these complications can require emergency medical care and may be life threatening.  CAUSES Almost all URIs are caused by viruses. A virus is a type of germ and can spread from one person to another.  RISKS FACTORS You may be at risk for a URI if:   You smoke.   You have chronic heart or lung disease.  You have a weakened defense (immune) system.   You are very young or very old.   You have nasal allergies or asthma.  You work in crowded or poorly ventilated areas.  You work in health care facilities or schools. SIGNS AND SYMPTOMS  Symptoms typically develop 2-3 days after you come in contact with a cold virus. Most viral URIs last 7-10 days. However, viral URIs from the influenza virus (flu virus) can last 14-18 days and are typically more severe. Symptoms may include:   Runny or stuffy (congested) nose.   Sneezing.   Cough.   Sore throat.   Headache.   Fatigue.   Fever.   Loss of appetite.   Pain in your forehead, behind your eyes, and over your cheekbones (sinus pain).  Muscle aches.  DIAGNOSIS  Your health care provider may diagnose a URI by:  Physical exam.  Tests to check that your symptoms are not due to  another condition such as:  Strep throat.  Sinusitis.  Pneumonia.  Asthma. TREATMENT  A URI goes away on its own with time. It cannot be cured with medicines, but medicines may be prescribed or recommended to relieve symptoms. Medicines may help:  Reduce your fever.  Reduce your cough.  Relieve nasal congestion. HOME CARE INSTRUCTIONS   Take medicines only as directed by your health care provider.   Gargle warm saltwater or take cough drops to comfort your throat as directed by your health care provider.  Use a warm mist humidifier or inhale steam from a shower to increase air moisture. This may make it easier to breathe.  Drink enough fluid to keep your urine clear or pale yellow.   Eat soups and other clear broths and maintain good nutrition.   Rest as needed.   Return to work when your temperature has returned to normal or as your health care provider advises. You may need to stay home longer to avoid infecting others. You can also use a face mask and careful hand washing to prevent spread of the virus.  Increase the usage of your inhaler if you have asthma.   Do not use any tobacco products, including cigarettes, chewing tobacco, or electronic cigarettes. If you need help quitting, ask your health care provider. PREVENTION  The best way to protect yourself from getting a cold is to practice good hygiene.   Avoid oral or hand contact with people with cold   symptoms.   Wash your hands often if contact occurs.  There is no clear evidence that vitamin C, vitamin E, echinacea, or exercise reduces the chance of developing a cold. However, it is always recommended to get plenty of rest, exercise, and practice good nutrition.  SEEK MEDICAL CARE IF:   You are getting worse rather than better.   Your symptoms are not controlled by medicine.   You have chills.  You have worsening shortness of breath.  You have brown or red mucus.  You have yellow or brown nasal  discharge.  You have pain in your face, especially when you bend forward.  You have a fever.  You have swollen neck glands.  You have pain while swallowing.  You have white areas in the back of your throat. SEEK IMMEDIATE MEDICAL CARE IF:   You have severe or persistent:  Headache.  Ear pain.  Sinus pain.  Chest pain.  You have chronic lung disease and any of the following:  Wheezing.  Prolonged cough.  Coughing up blood.  A change in your usual mucus.  You have a stiff neck.  You have changes in your:  Vision.  Hearing.  Thinking.  Mood. MAKE SURE YOU:   Understand these instructions.  Will watch your condition.  Will get help right away if you are not doing well or get worse.   This information is not intended to replace advice given to you by your health care provider. Make sure you discuss any questions you have with your health care provider.   Document Released: 03/03/2001 Document Revised: 01/22/2015 Document Reviewed: 12/13/2013 [employer] Interactive Patient Education 2016 [employer]
